# Patient Record
Sex: Female | Born: 1950 | Hispanic: Yes | Marital: Married | Smoking: Never smoker
Health system: Southern US, Community
[De-identification: ages and names within clinical notes are randomized; demographics above are authoritative.]

## PROBLEM LIST (undated history)

## (undated) DIAGNOSIS — E119 Type 2 diabetes mellitus without complications: Secondary | ICD-10-CM

## (undated) HISTORY — PX: CHOLECYSTECTOMY: SHX55

## (undated) HISTORY — PX: OTHER SURGICAL HISTORY: SHX169

---

## 2014-08-28 ENCOUNTER — Emergency Department (HOSPITAL_COMMUNITY): Payer: Medicaid Other

## 2014-08-28 ENCOUNTER — Observation Stay (HOSPITAL_COMMUNITY)
Admission: AD | Admit: 2014-08-28 | Discharge: 2014-08-30 | Disposition: A | Discharge status: Home / Self Care | Payer: Medicaid Other | Attending: Internal Medicine | Admitting: Internal Medicine

## 2014-08-28 ENCOUNTER — Encounter (HOSPITAL_COMMUNITY): Payer: Self-pay | Admitting: Emergency Medicine

## 2014-08-28 DIAGNOSIS — A059 Bacterial foodborne intoxication, unspecified: Secondary | ICD-10-CM | POA: Diagnosis present

## 2014-08-28 DIAGNOSIS — N179 Acute kidney failure, unspecified: Secondary | ICD-10-CM | POA: Insufficient documentation

## 2014-08-28 DIAGNOSIS — E872 Acidosis: Secondary | ICD-10-CM | POA: Diagnosis not present

## 2014-08-28 DIAGNOSIS — Z794 Long term (current) use of insulin: Secondary | ICD-10-CM | POA: Diagnosis not present

## 2014-08-28 DIAGNOSIS — E119 Type 2 diabetes mellitus without complications: Secondary | ICD-10-CM | POA: Insufficient documentation

## 2014-08-28 DIAGNOSIS — K529 Noninfective gastroenteritis and colitis, unspecified: Secondary | ICD-10-CM | POA: Diagnosis not present

## 2014-08-28 DIAGNOSIS — E8729 Other acidosis: Secondary | ICD-10-CM | POA: Diagnosis present

## 2014-08-28 DIAGNOSIS — R911 Solitary pulmonary nodule: Secondary | ICD-10-CM | POA: Diagnosis not present

## 2014-08-28 DIAGNOSIS — Z79899 Other long term (current) drug therapy: Secondary | ICD-10-CM | POA: Insufficient documentation

## 2014-08-28 DIAGNOSIS — R109 Unspecified abdominal pain: Secondary | ICD-10-CM | POA: Diagnosis present

## 2014-08-28 DIAGNOSIS — R112 Nausea with vomiting, unspecified: Secondary | ICD-10-CM | POA: Diagnosis present

## 2014-08-28 DIAGNOSIS — R197 Diarrhea, unspecified: Secondary | ICD-10-CM

## 2014-08-28 HISTORY — DX: Type 2 diabetes mellitus without complications: E11.9

## 2014-08-28 LAB — COMPREHENSIVE METABOLIC PANEL
ALK PHOS: 115 U/L (ref 39–117)
ALT: 31 U/L (ref 0–35)
ANION GAP: 22 — AB (ref 5–15)
AST: 37 U/L (ref 0–37)
Albumin: 4.3 g/dL (ref 3.5–5.2)
BILIRUBIN TOTAL: 0.6 mg/dL (ref 0.3–1.2)
BUN: 30 mg/dL — ABNORMAL HIGH (ref 6–23)
CHLORIDE: 93 meq/L — AB (ref 96–112)
CO2: 18 mEq/L — ABNORMAL LOW (ref 19–32)
CREATININE: 1.19 mg/dL — AB (ref 0.50–1.10)
Calcium: 9.6 mg/dL (ref 8.4–10.5)
GFR calc Af Amer: 56 mL/min — ABNORMAL LOW (ref >90)
GFR, EST NON AFRICAN AMERICAN: 48 mL/min — AB (ref >90)
Glucose, Bld: 227 mg/dL — ABNORMAL HIGH (ref 70–99)
Potassium: 3.6 mEq/L — ABNORMAL LOW (ref 3.7–5.3)
Sodium: 133 mEq/L — ABNORMAL LOW (ref 137–147)
Total Protein: 8.3 g/dL (ref 6.0–8.3)

## 2014-08-28 LAB — URINALYSIS, ROUTINE W REFLEX MICROSCOPIC
BILIRUBIN URINE: NEGATIVE
Glucose, UA: 500 mg/dL — AB
KETONES UR: 40 mg/dL — AB
Leukocytes, UA: NEGATIVE
NITRITE: NEGATIVE
Protein, ur: 30 mg/dL — AB
Specific Gravity, Urine: 1.016 (ref 1.005–1.030)
UROBILINOGEN UA: 0.2 mg/dL (ref 0.0–1.0)
pH: 6 (ref 5.0–8.0)

## 2014-08-28 LAB — CBC WITH DIFFERENTIAL/PLATELET
BASOS PCT: 0 % (ref 0–1)
Basophils Absolute: 0 10*3/uL (ref 0.0–0.1)
Eosinophils Absolute: 0 10*3/uL (ref 0.0–0.7)
Eosinophils Relative: 0 % (ref 0–5)
HEMATOCRIT: 42.7 % (ref 36.0–46.0)
HEMOGLOBIN: 15.4 g/dL — AB (ref 12.0–15.0)
LYMPHS PCT: 9 % — AB (ref 12–46)
Lymphs Abs: 1 10*3/uL (ref 0.7–4.0)
MCH: 32.8 pg (ref 26.0–34.0)
MCHC: 36.1 g/dL — AB (ref 30.0–36.0)
MCV: 91 fL (ref 78.0–100.0)
MONO ABS: 0.2 10*3/uL (ref 0.1–1.0)
Monocytes Relative: 2 % — ABNORMAL LOW (ref 3–12)
Neutro Abs: 9.8 10*3/uL — ABNORMAL HIGH (ref 1.7–7.7)
Neutrophils Relative %: 89 % — ABNORMAL HIGH (ref 43–77)
Platelets: 198 10*3/uL (ref 150–400)
RBC: 4.69 MIL/uL (ref 3.87–5.11)
RDW: 12.4 % (ref 11.5–15.5)
WBC: 11 10*3/uL — ABNORMAL HIGH (ref 4.0–10.5)

## 2014-08-28 LAB — BASIC METABOLIC PANEL
Anion gap: 19 — ABNORMAL HIGH (ref 5–15)
BUN: 21 mg/dL (ref 6–23)
CO2: 20 mEq/L (ref 19–32)
Calcium: 8.6 mg/dL (ref 8.4–10.5)
Chloride: 96 mEq/L (ref 96–112)
Creatinine, Ser: 0.83 mg/dL (ref 0.50–1.10)
GFR calc Af Amer: 86 mL/min — ABNORMAL LOW (ref >90)
GFR calc non Af Amer: 74 mL/min — ABNORMAL LOW (ref >90)
Glucose, Bld: 189 mg/dL — ABNORMAL HIGH (ref 70–99)
Potassium: 3.9 mEq/L (ref 3.7–5.3)
Sodium: 135 mEq/L — ABNORMAL LOW (ref 137–147)

## 2014-08-28 LAB — URINE MICROSCOPIC-ADD ON

## 2014-08-28 LAB — LACTIC ACID, PLASMA: Lactic Acid, Venous: 1.2 mmol/L (ref 0.5–2.2)

## 2014-08-28 LAB — CBC
HEMATOCRIT: 40 % (ref 36.0–46.0)
Hemoglobin: 14.4 g/dL (ref 12.0–15.0)
MCH: 32.5 pg (ref 26.0–34.0)
MCHC: 36 g/dL (ref 30.0–36.0)
MCV: 90.3 fL (ref 78.0–100.0)
Platelets: 184 10*3/uL (ref 150–400)
RBC: 4.43 MIL/uL (ref 3.87–5.11)
RDW: 12.2 % (ref 11.5–15.5)
WBC: 11.8 10*3/uL — ABNORMAL HIGH (ref 4.0–10.5)

## 2014-08-28 LAB — CBG MONITORING, ED: Glucose-Capillary: 176 mg/dL — ABNORMAL HIGH (ref 70–99)

## 2014-08-28 LAB — LIPASE, BLOOD: LIPASE: 44 U/L (ref 11–59)

## 2014-08-28 MED ORDER — INSULIN ASPART 100 UNIT/ML ~~LOC~~ SOLN
0.0000 [IU] | SUBCUTANEOUS | Status: DC
  Administered 2014-08-28 – 2014-08-29 (×2): 2 [IU] via SUBCUTANEOUS
  Administered 2014-08-29: 3 [IU] via SUBCUTANEOUS
  Administered 2014-08-29: 2 [IU] via SUBCUTANEOUS
  Administered 2014-08-29 (×2): 1 [IU] via SUBCUTANEOUS
  Administered 2014-08-30: 2 [IU] via SUBCUTANEOUS
  Administered 2014-08-30: 3 [IU] via SUBCUTANEOUS
  Filled 2014-08-28: qty 1

## 2014-08-28 MED ORDER — SODIUM CHLORIDE 0.9 % IV SOLN: Freq: Once | INTRAVENOUS | Status: DC

## 2014-08-28 MED ORDER — HEPARIN SODIUM (PORCINE) 5000 UNIT/ML IJ SOLN
5000.0000 [IU] | Freq: Three times a day (TID) | INTRAMUSCULAR | Status: DC
  Administered 2014-08-28 – 2014-08-30 (×5): 5000 [IU] via SUBCUTANEOUS
  Filled 2014-08-28 (×8): qty 1

## 2014-08-28 MED ORDER — SODIUM CHLORIDE 0.9 % IV SOLN
Freq: Once | INTRAVENOUS | Status: AC
  Administered 2014-08-28: 18:00:00 via INTRAVENOUS

## 2014-08-28 MED ORDER — ONDANSETRON HCL 4 MG/2ML IJ SOLN
4.0000 mg | Freq: Four times a day (QID) | INTRAMUSCULAR | Status: DC | PRN
  Administered 2014-08-28 – 2014-08-29 (×2): 4 mg via INTRAVENOUS
  Filled 2014-08-28 (×2): qty 2

## 2014-08-28 MED ORDER — SODIUM CHLORIDE 0.9 % IV BOLUS (SEPSIS)
1000.0000 mL | Freq: Once | INTRAVENOUS | Status: AC
  Administered 2014-08-28: 1000 mL via INTRAVENOUS

## 2014-08-28 MED ORDER — MORPHINE SULFATE 4 MG/ML IJ SOLN
4.0000 mg | INTRAMUSCULAR | Status: DC | PRN
  Administered 2014-08-28 (×2): 4 mg via INTRAVENOUS
  Filled 2014-08-28 (×2): qty 1

## 2014-08-28 MED ORDER — SODIUM CHLORIDE 0.9 % IV SOLN
INTRAVENOUS | Status: DC
  Administered 2014-08-28 (×2): via INTRAVENOUS

## 2014-08-28 MED ORDER — MORPHINE SULFATE 4 MG/ML IJ SOLN: 4.0000 mg | INTRAMUSCULAR | Status: DC | PRN

## 2014-08-28 MED ORDER — IOHEXOL 300 MG/ML  SOLN: 100.0000 mL | Freq: Once | INTRAMUSCULAR | Status: AC | PRN

## 2014-08-28 MED ORDER — SODIUM CHLORIDE 0.9 % IV BOLUS (SEPSIS): 1000.0000 mL | Freq: Once | INTRAVENOUS | Status: DC

## 2014-08-28 MED ORDER — ONDANSETRON HCL 4 MG/2ML IJ SOLN
4.0000 mg | Freq: Once | INTRAMUSCULAR | Status: AC
  Administered 2014-08-28: 4 mg via INTRAVENOUS
  Filled 2014-08-28: qty 2

## 2014-08-28 MED ORDER — SODIUM CHLORIDE 0.9 % IV BOLUS (SEPSIS)
500.0000 mL | Freq: Once | INTRAVENOUS | Status: AC
  Administered 2014-08-28: 500 mL via INTRAVENOUS

## 2014-08-28 MED ORDER — INSULIN DETEMIR 100 UNIT/ML ~~LOC~~ SOLN
10.0000 [IU] | Freq: Every day | SUBCUTANEOUS | Status: DC
  Administered 2014-08-28: 10 [IU] via SUBCUTANEOUS
  Filled 2014-08-28: qty 0.1

## 2014-08-28 NOTE — H&P (Signed)
[facility] History and Physical  Cindy MarusMaria Beasley ZOX:[id number] DOB: 02/14/1951 DOA: 08/28/2014  Referring physician: EDP PCP: No primary care provider on file.   Chief Complaint: N/V/D abd pain   HPI: Cindy MarusMaria Hinchman is a 63 y.o. female h/o DM2, presents to ED with N/V/D and abdominal pain.  [place]ptoms [place] in [place].  This occurs in the context of [place] a 2 month trip to [place].  Daughter had milder but similar symptoms which resolved she states.  Symptoms include chills on Monday but none since then.  [facility] and [facility] treated with IVF and antiemetics.  Has not felt better until yesterday, but last night symptoms recurred.  Did not take insulin last night.  Review of Systems: Systems reviewed.  As above, otherwise negative  Past Medical History  Diagnosis Date  . Diabetes mellitus without complication    Past Surgical History  Procedure Laterality Date  . Cholecystectomy    . 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 She has never used smokeless tobacco. She reports that she does not drink alcohol or use illicit drugs.  No Known Allergies  History reviewed. No pertinent family history.   Prior to Admission medications   Medication Sig Start Date End Date Taking? Authorizing Provider  insulin detemir (LEVEMIR) 100 UNIT/ML injection Inject 30 Units into the skin at bedtime.   Yes Historical Provider, MD  metFORMIN (GLUCOPHAGE) 850 MG tablet Take 850 mg by mouth 2 (two) times daily with a meal.   Yes Historical Provider, MD   Physical Exam: Filed Vitals:   08/28/14 1830  BP: 167/85  Pulse: 99  Temp:   Resp: 12    BP 167/85 mmHg  Pulse 99  Temp(Src) 98.2 F (36.8 C) (Oral)  Resp 12  SpO2 93%  General Appearance:    Alert, oriented, no distress, appears stated age  Head:    Normocephalic, atraumatic  Eyes:    PERRL, EOMI, sclera non-icteric         Nose:   Nares without drainage or epistaxis. Mucosa, turbinates normal  Throat:   Moist mucous membranes. Oropharynx without erythema or exudate.  Neck:   Supple. No carotid bruits.  No thyromegaly.  No lymphadenopathy.   Back:     No CVA tenderness, no spinal tenderness  Lungs:     Clear to auscultation bilaterally, without wheezes, rhonchi or rales  Chest wall:    No tenderness to palpitation  Heart:    Regular rate and rhythm without murmurs, gallops, rubs  Abdomen:     Soft, non-tender, nondistended, normal bowel sounds, no organomegaly  Genitalia:    deferred  Rectal:    deferred  Extremities:   No clubbing, cyanosis or edema.  Pulses:   2+ and symmetric all extremities  Skin:   Skin color, texture, turgor normal, no rashes or lesions  Lymph nodes:   Cervical, supraclavicular, and axillary nodes normal  Neurologic:   CNII-XII intact. Normal strength, sensation and reflexes      throughout    Labs on Admission:  Basic Metabolic Panel:  Recent Labs Lab 08/28/14 1420  NA 133*  K 3.6*  CL 93*  CO2 18*  GLUCOSE 227*  BUN 30*  CREATININE 1.19*  CALCIUM 9.6   Liver Function Tests:  Recent Labs Lab 08/28/14 1420  AST 37  ALT 31  ALKPHOS 115  BILITOT  0.6  PROT 8.3  ALBUMIN 4.3    Recent Labs Lab 08/28/14 1420  LIPASE 44   No results for input(s): AMMONIA in the last 168 hours. CBC:  Recent Labs Lab 08/28/14 1420  WBC 11.0*  NEUTROABS 9.8*  HGB 15.4*  HCT 42.7  MCV 91.0  PLT 198   Cardiac Enzymes: No results for input(s): CKTOTAL, CKMB, CKMBINDEX, TROPONINI in the last 168 hours.  BNP (last 3 results) No results for input(s): PROBNP in the last 8760 hours. CBG:  Recent Labs Lab 08/28/14 1930  GLUCAP 176*    Radiological Exams on Admission: Ct Abdomen Pelvis Wo Contrast  08/28/2014   CLINICAL DATA:  Abdominal pain, left upper quadrant.  EXAM: CT ABDOMEN AND PELVIS WITHOUT CONTRAST  TECHNIQUE: Multidetector CT imaging of the abdomen and  pelvis was performed following the standard protocol without IV contrast.  COMPARISON:  None.  FINDINGS: BODY WALL: Unremarkable.  LOWER CHEST: 2 5 mm pulmonary nodules in the right lower lobe, present on images 2 and 4.  ABDOMEN/PELVIS:  Liver: No focal abnormality.  Biliary: Cholecystectomy.  Pancreas: Unremarkable.  Spleen: Unremarkable.  Adrenals: Unremarkable.  Kidneys and ureters: Fullness of the urinary collecting system secondary to bladder distention. No stone or other obstructive process identified. Two presumed cyst in the interpolar left kidney, measuring approximately 1 cm.  Bladder: Distended.  Reproductive: Unremarkable.  Bowel: No obstruction. Appendix not identified; no pericecal inflammation.  Retroperitoneum: No mass or adenopathy.  Peritoneum: No ascites or pneumoperitoneum.  Vascular: No acute abnormality.  OSSEOUS: Spinal canal stenosis at L2-3 and below secondary to facet overgrowth, short pedicles, and degenerative disc disease.  IMPRESSION: 1. No acute intra-abdominal findings. 2. Distended urinary bladder. 3. Two 5 mm pulmonary nodules in the right lower lobe. If the patient is at high risk for bronchogenic carcinoma, follow-up chest CT at 6-12 months is recommended. If the patient is at low risk for bronchogenic carcinoma, follow-up chest CT at 12 months is recommended. This recommendation follows the consensus statement: Guidelines for Management of Small Pulmonary Nodules Detected on CT Scans: A Statement from the Fleischner Society as published in Radiology 2005;237:395-400.   Electronically Signed   By: Tiburcio PeaJonathan  Watts M.D.   On: 08/28/2014 18:22    EKG: Independently reviewed.  Assessment/Plan Active Problems:   Nausea vomiting and diarrhea   DM2 (diabetes mellitus, type 2)   Food poisoning   High anion gap metabolic acidosis   1. N/V/D and abdominal pain - likely due to food poisoning given daughter similar symptoms after eating same meal. 1. IVF, nausea and pain  control 2. High anion gap metabolic acidosis - AG mildly elevated at 22 1. Does have few keytones in urine, but doubt DKA as the primary issue given BGL was only 220 on arrival in this type 2 diabetic 2. Checking lactic acid 3. Will see if this has cleared up after 3L IVF she got in ED by rechecking BMP now. 3. DM2 - putting patient on levimir 10 units QHS (takes 30 at home), and low dose SSI q4h for now.    Code Status: Full Code  Family Communication: Daughter at bedside Disposition Plan: Admit to obs   Time spent: 50 min  GARDNER, JARED M. [facility] Pager [phone]  If 7AM-7PM, please contact the day team taking care of the patient [email or web address] Password [email or web address] 08/28/2014, 7:48 PM

## 2014-08-28 NOTE — ED Notes (Addendum)
[facility] c/o LUQ abdominal pain that radiates to the back since Sunday. She is own her back to [place] from [place] in [place] she has visited to x 2 months. Nausea and vomiting present. [facility] and was diagnosed with GI virus. Pain 10/10. Hx of DM and has had her gallbladder removed 4 years ago.

## 2014-08-28 NOTE — ED Provider Notes (Signed)
CSN: [id number]     Arrival date & time 08/28/14  1339 History   First MD Initiated Contact with Patient 08/28/14 1456     Chief Complaint  Patient presents with  . Abdominal Pain  . Nausea  . Emesis      HPI  His recent evaluation of abdominal pain, nausea, vomiting, diarrhea. History of take 2, no insulin dependent diabetes. Is been visiting in [place] for 2 months. Traveling via bus back to [place] where she lives. Was in [facility] on Monday with a 12 hour illness. Was treated with fluids and antiemetics. Given prescription for Zofran, Bentyl, and Protonix. Did not feel days. Had a better day yesterday recurred with symptoms last night vomiting and diarrhea "every hour to". Brought by EMS from the bus station to here.  Monday felt chilled. Uncertain if she had fever. No fever shakes chills since then. No dyspnea. No cough or urinary symptoms. Heme-negative nonbilious emesis. No bloody stools.  Past Medical History  Diagnosis Date  . Diabetes mellitus without complication    Past Surgical History  Procedure Laterality Date  . Cholecystectomy     No family history on file. History  Substance Use Topics  . Smoking status: Never Smoker   . Smokeless tobacco: Not on file  . Alcohol Use: No   OB History    No data available     Review of Systems  Constitutional: Negative for fever, chills, diaphoresis, appetite change and fatigue.  HENT: Negative for mouth sores, sore throat and trouble swallowing.   Eyes: Negative for visual disturbance.  Respiratory: Negative for cough, chest tightness, shortness of breath and wheezing.   Cardiovascular: Negative for chest pain.  Gastrointestinal: Positive for nausea, vomiting, abdominal pain and diarrhea. Negative for abdominal distention.  Endocrine: Negative for polydipsia, polyphagia and polyuria.  Genitourinary: Negative for dysuria, frequency and hematuria.  Musculoskeletal: Negative for gait problem.  Skin: Negative for  color change, pallor and rash.  Neurological: Positive for dizziness. Negative for syncope, light-headedness and headaches.  Hematological: Does not bruise/bleed easily.  Psychiatric/Behavioral: Negative for behavioral problems and confusion.      Allergies  Review of patient's allergies indicates no known allergies.  Home Medications   Prior to Admission medications   Medication Sig Start Date End Date Taking? Authorizing Provider  insulin detemir (LEVEMIR) 100 UNIT/ML injection Inject 30 Units into the skin at bedtime.   Yes Historical Provider, MD  metFORMIN (GLUCOPHAGE) 850 MG tablet Take 850 mg by mouth 2 (two) times daily with a meal.   Yes Historical Provider, MD   BP 167/85 mmHg  Pulse 99  Temp(Src) 98.2 F (36.8 C) (Oral)  Resp 12  SpO2 93% Physical Exam  Constitutional: She is oriented to person, place, and time. She appears well-developed and well-nourished. No distress.  HENT:  Head: Normocephalic.  Eyes: Conjunctivae are normal. Pupils are equal, round, and reactive to light. No scleral icterus.  Neck: Normal range of motion. Neck supple. No thyromegaly present.  Cardiovascular: Normal rate and regular rhythm.  Exam reveals no gallop and no friction rub.   No murmur heard. Pulmonary/Chest: Effort normal and breath sounds normal. No respiratory distress. She has no wheezes. She has no rales.  Abdominal: Soft. Bowel sounds are normal. She exhibits no distension. There is no tenderness. There is no rebound.  Tender in the left upper abdomen. Present bowel sounds. No peritoneal irritation. Healed cholecystectomy scar.  Musculoskeletal: Normal range of motion.  Neurological: She is alert and oriented  to person, place, and time.  Skin: Skin is warm and dry. No rash noted.  Psychiatric: She has a normal mood and affect. Her behavior is normal.    ED Course  Procedures (including critical care time) Labs Review Labs Reviewed  CBC WITH DIFFERENTIAL - Abnormal; Notable  for the following:    WBC 11.0 (*)    Hemoglobin 15.4 (*)    MCHC 36.1 (*)    Neutrophils Relative % 89 (*)    Neutro Abs 9.8 (*)    Lymphocytes Relative 9 (*)    Monocytes Relative 2 (*)    All other components within normal limits  COMPREHENSIVE METABOLIC PANEL - Abnormal; Notable for the following:    Sodium 133 (*)    Potassium 3.6 (*)    Chloride 93 (*)    CO2 18 (*)    Glucose, Bld 227 (*)    BUN 30 (*)    Creatinine, Ser 1.19 (*)    GFR calc non Af Amer 48 (*)    GFR calc Af Amer 56 (*)    Anion gap 22 (*)    All other components within normal limits  URINALYSIS, ROUTINE W REFLEX MICROSCOPIC - Abnormal; Notable for the following:    Glucose, UA 500 (*)    Hgb urine dipstick SMALL (*)    Ketones, ur 40 (*)    Protein, ur 30 (*)    All other components within normal limits  LIPASE, BLOOD  URINE MICROSCOPIC-ADD ON    Imaging Review Ct Abdomen Pelvis Wo Contrast  08/28/2014   CLINICAL DATA:  Abdominal pain, left upper quadrant.  EXAM: CT ABDOMEN AND PELVIS WITHOUT CONTRAST  TECHNIQUE: Multidetector CT imaging of the abdomen and pelvis was performed following the standard protocol without IV contrast.  COMPARISON:  None.  FINDINGS: BODY WALL: Unremarkable.  LOWER CHEST: 2 5 mm pulmonary nodules in the right lower lobe, present on images 2 and 4.  ABDOMEN/PELVIS:  Liver: No focal abnormality.  Biliary: Cholecystectomy.  Pancreas: Unremarkable.  Spleen: Unremarkable.  Adrenals: Unremarkable.  Kidneys and ureters: Fullness of the urinary collecting system secondary to bladder distention. No stone or other obstructive process identified. Two presumed cyst in the interpolar left kidney, measuring approximately 1 cm.  Bladder: Distended.  Reproductive: Unremarkable.  Bowel: No obstruction. Appendix not identified; no pericecal inflammation.  Retroperitoneum: No mass or adenopathy.  Peritoneum: No ascites or pneumoperitoneum.  Vascular: No acute abnormality.  OSSEOUS: Spinal canal  stenosis at L2-3 and below secondary to facet overgrowth, short pedicles, and degenerative disc disease.  IMPRESSION: 1. No acute intra-abdominal findings. 2. Distended urinary bladder. 3. Two 5 mm pulmonary nodules in the right lower lobe. If the patient is at high risk for bronchogenic carcinoma, follow-up chest CT at 6-12 months is recommended. If the patient is at low risk for bronchogenic carcinoma, follow-up chest CT at 12 months is recommended. This recommendation follows the consensus statement: Guidelines for Management of Small Pulmonary Nodules Detected on CT Scans: A Statement from the Fleischner Society as published in Radiology 2005;237:395-400.   Electronically Signed   By: Tiburcio PeaJonathan  Watts M.D.   On: 08/28/2014 18:22     EKG Interpretation None      MDM   Final diagnoses:  Abdominal pain    Patient CT scan shows no acute findings. On exam she has a sounds present. Not clinically in ileus. Her pain is better. If she remains nauseated. She drinks just a few sips of fluids and sensor watered-down  complains of nausea. Benign examined. She is traveling. She is diabetic. She is unable tolerate by mouth. We'll speak with the hospitalist regarding admission/observation.    Rolland PorterMark Ghalia Reicks, MD 09/02/14 [phone]

## 2014-08-28 NOTE — ED Notes (Signed)
Patient transported to CT 

## 2014-08-28 NOTE — ED Notes (Signed)
Bed: WA16 Expected date:  Expected time:  Means of arrival:  Comments: EMS 

## 2014-08-28 NOTE — ED Notes (Signed)
Patient back from CT.

## 2014-08-29 DIAGNOSIS — E1165 Type 2 diabetes mellitus with hyperglycemia: Secondary | ICD-10-CM

## 2014-08-29 LAB — GLUCOSE, CAPILLARY
Glucose-Capillary: 139 mg/dL — ABNORMAL HIGH (ref 70–99)
Glucose-Capillary: 143 mg/dL — ABNORMAL HIGH (ref 70–99)
Glucose-Capillary: 148 mg/dL — ABNORMAL HIGH (ref 70–99)
Glucose-Capillary: 212 mg/dL — ABNORMAL HIGH (ref 70–99)
Glucose-Capillary: 142 mg/dL — ABNORMAL HIGH (ref 70–99)
Glucose-Capillary: 157 mg/dL — ABNORMAL HIGH (ref 70–99)

## 2014-08-29 MED ORDER — INSULIN DETEMIR 100 UNIT/ML ~~LOC~~ SOLN
15.0000 [IU] | Freq: Every day | SUBCUTANEOUS | Status: DC
  Administered 2014-08-29: 15 [IU] via SUBCUTANEOUS
  Filled 2014-08-29: qty 0.15

## 2014-08-29 NOTE — Progress Notes (Signed)
[facility] PROGRESS NOTE  Cindy Beasley ZOX:[id number] DOB: 09-08-1951 DOA: 08/28/2014 PCP: No primary care provider on file.  Assessment/Plan: 1-Gastroenteritis; symptoms has improved. CT abdomen negative.  Advance diet today.   2-Anion gap metabolic acidosis; improved with fluids, insulin. Repeat B-met in am.   3-Small pulmonary nodule. Patient and daughter aware of results. Need CT in 12 month. She is non smoker.   4-Diabetes; increase levemir to 15 units. SSI.   5-AKI; related to decrease volume., improved with IV fluids.   Code Status: full code.  Family Communication: care discussed with daughter who was at bedside.  Disposition Plan: discharge 11-13 if tolerates diet.    Consultants:  none  Procedures:  none  Antibiotics:  none  HPI/Subjective: Feeling better, no abdominal pain, nausea has improved.   Objective: Filed Vitals:   08/29/14 1313  BP: 144/54  Pulse: 77  Temp: 98.1 F (36.7 C)  Resp: 20    Intake/Output Summary (Last 24 hours) at 08/29/14 1450 Last data filed at 08/29/14 0600  Gross per 24 hour  Intake 1214.58 ml  Output      0 ml  Net 1214.58 ml   Filed Weights   08/28/14 2105  Weight: 86.274 kg (190 lb 3.2 oz)    Exam:   General:  Alert in no distress.   Cardiovascular: S 1, S 2 RRR  Respiratory: CTA  Abdomen: BS present, soft, NT  Musculoskeletal: no edema.   Data Reviewed: Basic Metabolic Panel:  Recent Labs Lab 08/28/14 1420 08/28/14 1934  NA 133* 135*  K 3.6* 3.9  CL 93* 96  CO2 18* 20  GLUCOSE 227* 189*  BUN 30* 21  CREATININE 1.19* 0.83  CALCIUM 9.6 8.6   Liver Function Tests:  Recent Labs Lab 08/28/14 1420  AST 37  ALT 31  ALKPHOS 115  BILITOT 0.6  PROT 8.3  ALBUMIN 4.3    Recent Labs Lab 08/28/14 1420  LIPASE 44   No results for input(s): AMMONIA in the last 168 hours. CBC:  Recent Labs Lab 08/28/14 1420 08/28/14 1934  WBC 11.0* 11.8*  NEUTROABS 9.8*  --   HGB 15.4* 14.4   HCT 42.7 40.0  MCV 91.0 90.3  PLT 198 184   Cardiac Enzymes: No results for input(s): CKTOTAL, CKMB, CKMBINDEX, TROPONINI in the last 168 hours. BNP (last 3 results) No results for input(s): PROBNP in the last 8760 hours. CBG:  Recent Labs Lab 08/28/14 1930 08/28/14 2356 08/29/14 0401 08/29/14 0708 08/29/14 1134  GLUCAP 176* 148* 139* 143* 212*    No results found for this or any previous visit (from the past 240 hour(s)).   Studies: Ct Abdomen Pelvis Wo Contrast  08/28/2014   CLINICAL DATA:  Abdominal pain, left upper quadrant.  EXAM: CT ABDOMEN AND PELVIS WITHOUT CONTRAST  TECHNIQUE: Multidetector CT imaging of the abdomen and pelvis was performed following the standard protocol without IV contrast.  COMPARISON:  None.  FINDINGS: BODY WALL: Unremarkable.  LOWER CHEST: 2 5 mm pulmonary nodules in the right lower lobe, present on images 2 and 4.  ABDOMEN/PELVIS:  Liver: No focal abnormality.  Biliary: Cholecystectomy.  Pancreas: Unremarkable.  Spleen: Unremarkable.  Adrenals: Unremarkable.  Kidneys and ureters: Fullness of the urinary collecting system secondary to bladder distention. No stone or other obstructive process identified. Two presumed cyst in the interpolar left kidney, measuring approximately 1 cm.  Bladder: Distended.  Reproductive: Unremarkable.  Bowel: No obstruction. Appendix not identified; no pericecal inflammation.  Retroperitoneum: No mass or  adenopathy.  Peritoneum: No ascites or pneumoperitoneum.  Vascular: No acute abnormality.  OSSEOUS: Spinal canal stenosis at L2-3 and below secondary to facet overgrowth, short pedicles, and degenerative disc disease.  IMPRESSION: 1. No acute intra-abdominal findings. 2. Distended urinary bladder. 3. Two 5 mm pulmonary nodules in the right lower lobe. If the patient is at high risk for bronchogenic carcinoma, follow-up chest CT at 6-12 months is recommended. If the patient is at low risk for bronchogenic carcinoma, follow-up chest  CT at 12 months is recommended. This recommendation follows the consensus statement: Guidelines for Management of Small Pulmonary Nodules Detected on CT Scans: A Statement from the [employer] as published in Radiology 2005;237:395-400.   Electronically Signed   By: Jorje Guild M.D.   On: 08/28/2014 18:22    Scheduled Meds: . sodium chloride   Intravenous Once  . heparin  5,000 Units Subcutaneous 3 times per day  . insulin aspart  0-9 Units Subcutaneous 6 times per day  . insulin detemir  10 Units Subcutaneous QHS  . sodium chloride  1,000 mL Intravenous Once   Continuous Infusions: . sodium chloride 100 mL/hr at 08/29/14 1113    Active Problems:   Nausea vomiting and diarrhea   DM2 (diabetes mellitus, type 2)   Food poisoning   High anion gap metabolic acidosis    Time spent: 35 minutes.     Niel Hummer A  [facility] Pager [phone]. If 7PM-7AM, please contact night-coverage at [email or web address], password [email or web address] 08/29/2014, 2:50 PM  LOS: 1 day

## 2014-08-29 NOTE — Progress Notes (Signed)
UR completed 

## 2014-08-30 DIAGNOSIS — E11319 Type 2 diabetes mellitus with unspecified diabetic retinopathy without macular edema: Secondary | ICD-10-CM

## 2014-08-30 LAB — GLUCOSE, CAPILLARY
GLUCOSE-CAPILLARY: 94 mg/dL (ref 70–99)
GLUCOSE-CAPILLARY: 215 mg/dL — AB (ref 70–99)
Glucose-Capillary: 139 mg/dL — ABNORMAL HIGH (ref 70–99)
Glucose-Capillary: 148 mg/dL — ABNORMAL HIGH (ref 70–99)
Glucose-Capillary: 68 mg/dL — ABNORMAL LOW (ref 70–99)

## 2014-08-30 LAB — BASIC METABOLIC PANEL
Anion gap: 13 (ref 5–15)
BUN: 16 mg/dL (ref 6–23)
CO2: 25 mEq/L (ref 19–32)
Calcium: 8.7 mg/dL (ref 8.4–10.5)
Chloride: 102 mEq/L (ref 96–112)
Creatinine, Ser: 0.81 mg/dL (ref 0.50–1.10)
GFR calc non Af Amer: 76 mL/min — ABNORMAL LOW (ref >90)
GFR, EST AFRICAN AMERICAN: 88 mL/min — AB (ref >90)
Glucose, Bld: 158 mg/dL — ABNORMAL HIGH (ref 70–99)
POTASSIUM: 3.1 meq/L — AB (ref 3.7–5.3)
SODIUM: 140 meq/L (ref 137–147)

## 2014-08-30 MED ORDER — INSULIN DETEMIR 100 UNIT/ML ~~LOC~~ SOLN: 10.0000 [IU] | Freq: Every day | SUBCUTANEOUS | Status: AC

## 2014-08-30 MED ORDER — INSULIN DETEMIR 100 UNIT/ML ~~LOC~~ SOLN: 10.0000 [IU] | Freq: Every day | SUBCUTANEOUS | Status: DC

## 2014-08-30 MED ORDER — POTASSIUM CHLORIDE CRYS ER 20 MEQ PO TBCR
40.0000 meq | EXTENDED_RELEASE_TABLET | Freq: Once | ORAL | Status: AC
Start: 2014-08-30 — End: 2014-08-30
  Administered 2014-08-30: 40 meq via ORAL
  Filled 2014-08-30: qty 2

## 2014-08-30 MED ORDER — DEXTROSE 50 % IV SOLN
INTRAVENOUS | Status: AC
Start: 2014-08-30 — End: 2014-08-30
  Administered 2014-08-30: 50 mL
  Filled 2014-08-30: qty 50

## 2014-08-30 MED ORDER — INSULIN DETEMIR 100 UNIT/ML ~~LOC~~ SOLN: 12.0000 [IU] | Freq: Every day | SUBCUTANEOUS | Status: DC

## 2014-08-30 NOTE — Discharge Summary (Signed)
Physician Discharge Summary  Cindy MarusMaria Beasley YNW:[id number] DOB: 02/23/1951 DOA: 08/28/2014  PCP: No primary care provider on file.  Admit date: 08/28/2014 Discharge date: 08/30/2014  Time spent: 35 minutes  Recommendations for Outpatient Follow-up:  1. Need follow up Ct chest in 6 to 12 month to follow lung nodule.   Discharge Diagnoses:    Gastroenteritis.    Nausea vomiting and diarrhea   DM2 (diabetes mellitus, type 2)   Food poisoning   High anion gap metabolic acidosis   Discharge Condition: stable.  Diet recommendation: Carb Modified   Filed Weights   08/28/14 2105  Weight: 86.274 kg (190 lb 3.2 oz)    History of present illness:  Cindy MarusMaria Gingrich is a 63 y.o. female h/o DM2, presents to ED with N/V/D and abdominal pain. [place]. This occurs in the context of [place] a 2 [place] trip to [place]. Daughter had milder but similar symptoms which resolved she states. Symptoms include chills on Monday but none since then. [facility] and was treated with IVF and antiemetics. Has not felt better until yesterday, but last night symptoms recurred. Did not take insulin last night.  Hospital Course:  1-Gastroenteritis; symptoms has improved. CT abdomen negative.  Resolved with supportive care. Tolerating diet. No abdominal pain.   2-Anion gap metabolic acidosis; Resolved  with fluids, insulin.   3-Small pulmonary nodule. Patient and daughter aware of results. Need CT in 12 month. She is non smoker.   4-Diabetes; Discharge on  levemir to 10 units. SSI.   5-AKI; related to decrease volume., improved with IV fluids.   Procedures:  none  Consultations:  none  Discharge Exam: Filed Vitals:   08/30/14 0539  BP: 138/65  Pulse: 79  Temp: 98.3 F (36.8 C)  Resp: 20    General: Alert in no distress.  Cardiovascular: S 1, S 2 RRR Respiratory: CTA  Discharge Instructions You were cared for  by a hospitalist during your hospital stay. If you have any questions about your discharge medications or the care you received while you were in the hospital after you are discharged, you can call the unit and asked to speak with the hospitalist on call if the hospitalist that took care of you is not available. Once you are discharged, your primary care physician will handle any further medical issues. Please note that NO REFILLS for any discharge medications will be authorized once you are discharged, as it is imperative that you return to your primary care physician (or establish a relationship with a primary care physician if you do not have one) for your aftercare needs so that they can reassess your need for medications and monitor your lab values.  Discharge Instructions    Diet Carb Modified    Complete by:  As directed      Increase activity slowly    Complete by:  As directed           Current Discharge Medication List    CONTINUE these medications which have CHANGED   Details  insulin detemir (LEVEMIR) 100 UNIT/ML injection Inject 0.1 mLs (10 Units total) into the skin at bedtime. Qty: 10 mL, Refills: 11      CONTINUE these medications which have NOT CHANGED   Details  metFORMIN (GLUCOPHAGE) 850 MG tablet Take 850 mg by mouth 2 (two) times daily with a meal.       No Known Allergies  The results of significant diagnostics from this hospitalization (including imaging, microbiology, ancillary and laboratory) are listed below for reference.    Significant Diagnostic Studies: Ct Abdomen Pelvis Wo Contrast  08/28/2014   CLINICAL DATA:  Abdominal pain, left upper quadrant.  EXAM: CT ABDOMEN AND PELVIS WITHOUT CONTRAST  TECHNIQUE: Multidetector CT imaging of the abdomen and pelvis was performed following the standard protocol without IV contrast.  COMPARISON:  None.  FINDINGS: BODY WALL: Unremarkable.  LOWER CHEST: 2 5 mm pulmonary nodules in the right lower lobe, present on  images 2 and 4.  ABDOMEN/PELVIS:  Liver: No focal abnormality.  Biliary: Cholecystectomy.  Pancreas: Unremarkable.  Spleen: Unremarkable.  Adrenals: Unremarkable.  Kidneys and ureters: Fullness of the urinary collecting system secondary to bladder distention. No stone or other obstructive process identified. Two presumed cyst in the interpolar left kidney, measuring approximately 1 cm.  Bladder: Distended.  Reproductive: Unremarkable.  Bowel: No obstruction. Appendix not identified; no pericecal inflammation.  Retroperitoneum: No mass or adenopathy.  Peritoneum: No ascites or pneumoperitoneum.  Vascular: No acute abnormality.  OSSEOUS: Spinal canal stenosis at L2-3 and below secondary to facet overgrowth, short pedicles, and degenerative disc disease.  IMPRESSION: 1. No acute intra-abdominal findings. 2. Distended urinary bladder. 3. Two 5 mm pulmonary nodules in the right lower lobe. If the patient is at high risk for bronchogenic carcinoma, follow-up chest CT at 6-12 months is recommended. If the patient is at low risk for bronchogenic carcinoma, follow-up chest CT at 12 months is recommended. This recommendation follows the consensus statement: Guidelines for Management of Small Pulmonary Nodules Detected on CT Scans: A Statement from the [employer] as published in Radiology 2005;237:395-400.   Electronically Signed   By: Tiburcio PeaJonathan  Watts M.D.   On: 08/28/2014 18:22    Microbiology: No results found for this or any previous visit (from the past 240 hour(s)).   Labs: Basic Metabolic Panel:  Recent Labs Lab 08/28/14 1420 08/28/14 1934 08/30/14 0425  NA 133* 135* 140  K 3.6* 3.9 3.1*  CL 93* 96 102  CO2 18* 20 25  GLUCOSE 227* 189* 158*  BUN 30* 21 16  CREATININE 1.19* 0.83 0.81  CALCIUM 9.6 8.6 8.7   Liver Function Tests:  Recent Labs Lab 08/28/14 1420  AST 37  ALT 31  ALKPHOS 115  BILITOT 0.6  PROT 8.3  ALBUMIN 4.3    Recent Labs Lab 08/28/14 1420  LIPASE 44   No  results for input(s): AMMONIA in the last 168 hours. CBC:  Recent Labs Lab 08/28/14 1420 08/28/14 1934  WBC 11.0* 11.8*  NEUTROABS 9.8*  --   HGB 15.4* 14.4  HCT 42.7 40.0  MCV 91.0 90.3  PLT 198 184   Cardiac Enzymes: No results for input(s): CKTOTAL, CKMB, CKMBINDEX, TROPONINI in the last 168 hours. BNP: BNP (last 3 results) No results for input(s): PROBNP in the last 8760 hours. CBG:  Recent Labs Lab 08/30/14 0006 08/30/14 0401 08/30/14 0430 08/30/14 0719 08/30/14 1125  GLUCAP 148* 68* 139* 94 215*       Signed:  Emmagene Ortner A  [facility] 08/30/2014, 12:02 PM

## 2014-08-31 NOTE — Progress Notes (Signed)
Pt discharged home with her daughter. Discharge instructions provided to both patient and her daughter. Both verbalized understanding discharge instructions, follow-up appts, and medications. V S stable at the time of discharge. Pt denies pain and any other comments or concerns.

## 2015-10-16 IMAGING — CT CT ABD-PELV W/O CM
1 of 2 series · 15 of 32 positions shown, 19 images · non-contrast
Comparison: None.

CLINICAL DATA: Abdominal pain, left upper quadrant.

EXAM:
CT ABDOMEN AND PELVIS WITHOUT CONTRAST
TECHNIQUE: Multidetector CT imaging of the abdomen and pelvis was performed
following the standard protocol without IV contrast.

[Series 2: abd/pel w/o · axial · non-contrast · 0.86mm/px · z∈[+751,+1161]mm · 15 of 90 slices shown, 19 images]
[im 4/90  soft-tissue]
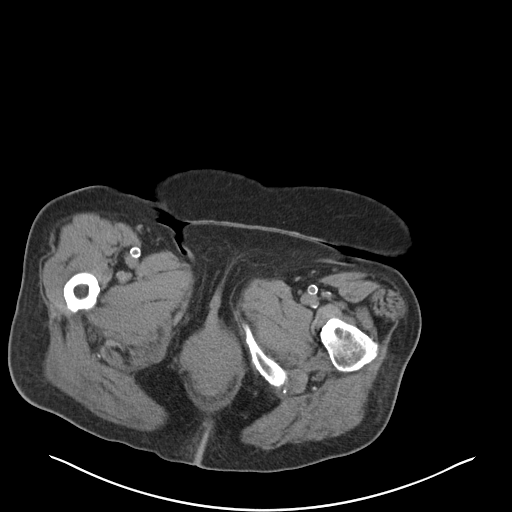
[im 4/90  bone]
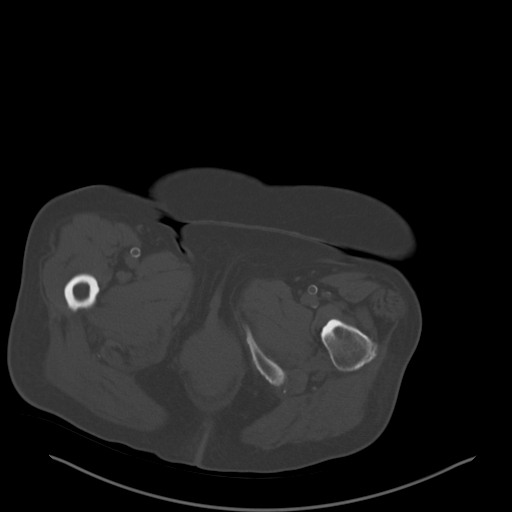
[im 11/90  soft-tissue]
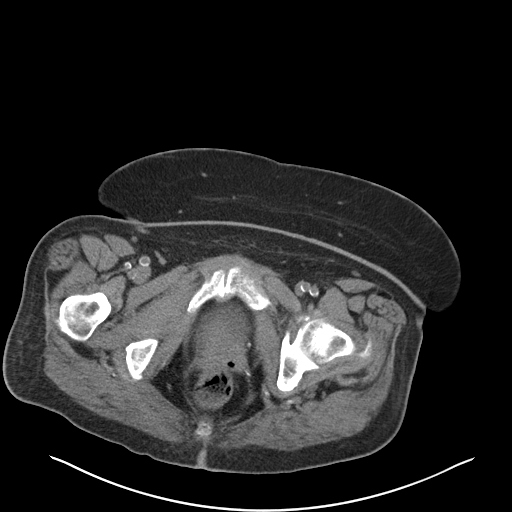
[im 18/90  soft-tissue]
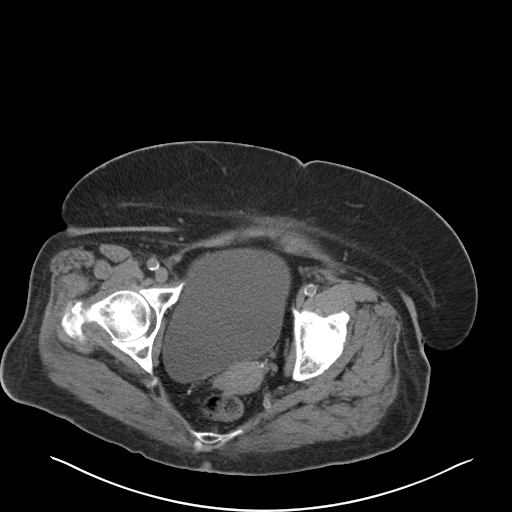
[im 24/90  soft-tissue]
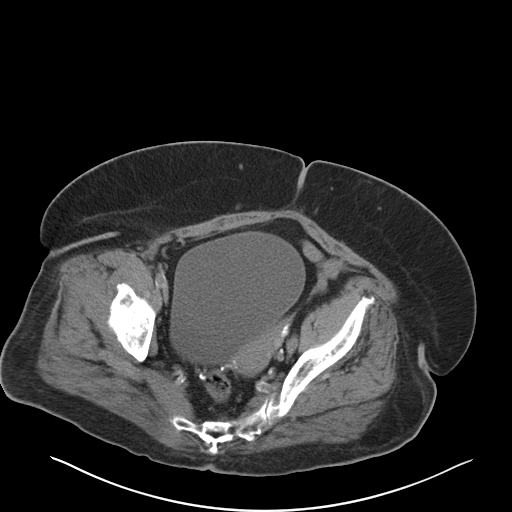
[im 31/90  soft-tissue]
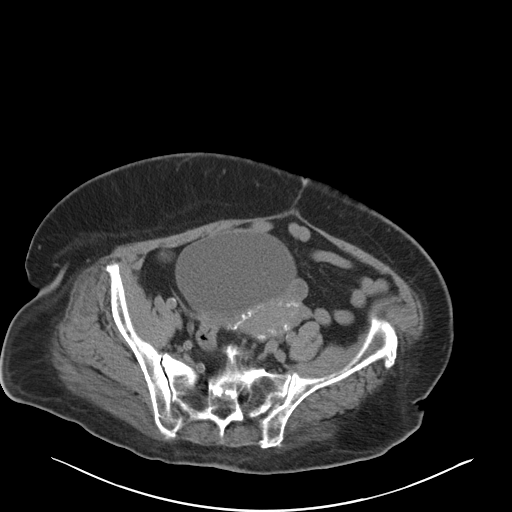
[im 38/90  soft-tissue]
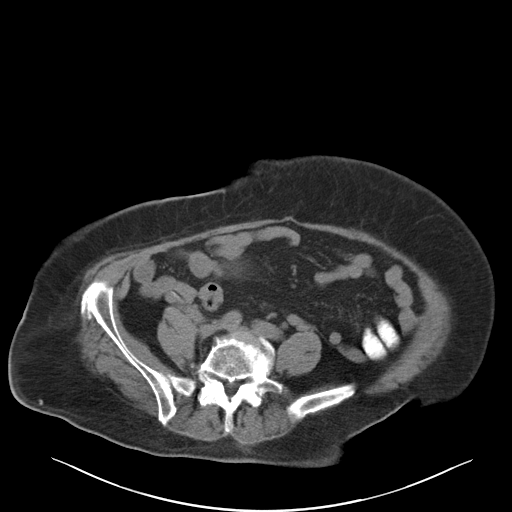
[im 45/90  soft-tissue]
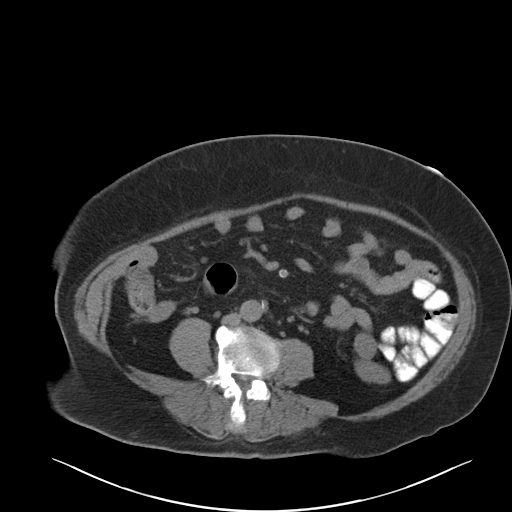
[im 52/90  soft-tissue]
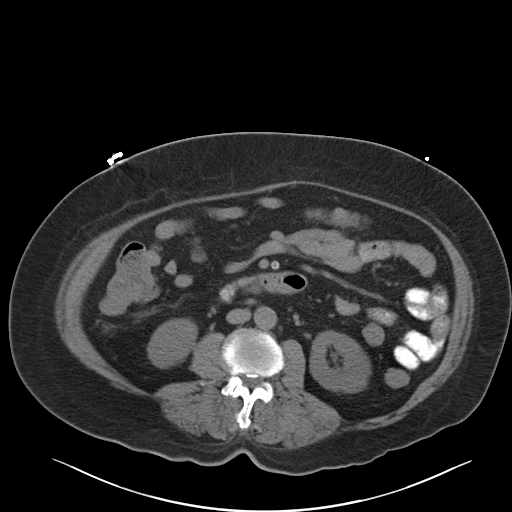
[im 59/90  soft-tissue]
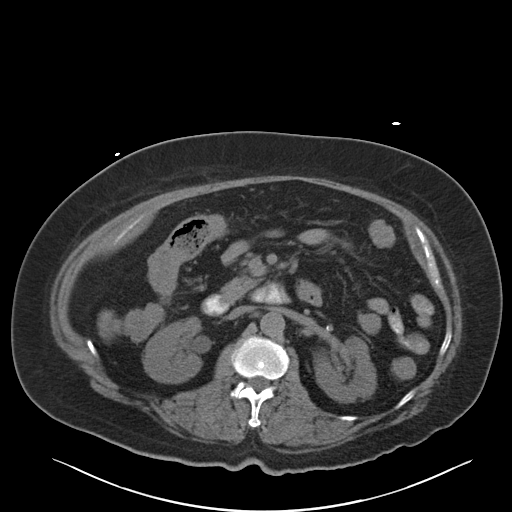
[im 59/90  bone]
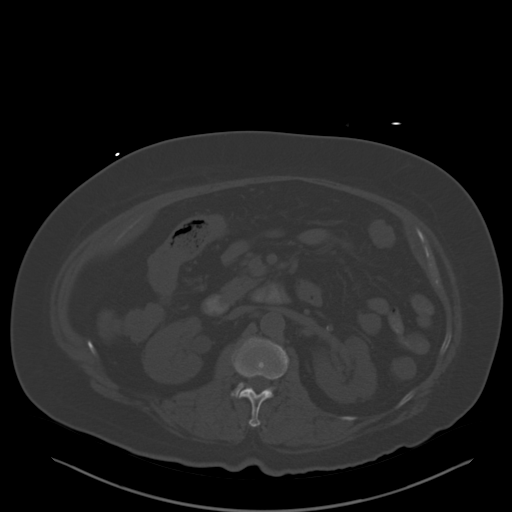
[im 66/90  soft-tissue]
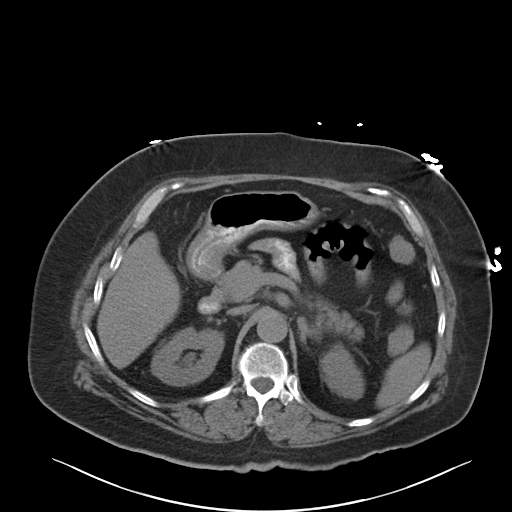
[im 72/90  soft-tissue]
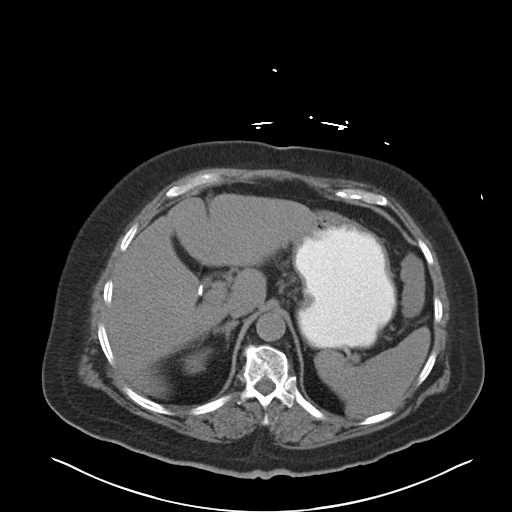
[im 76/90  lung]
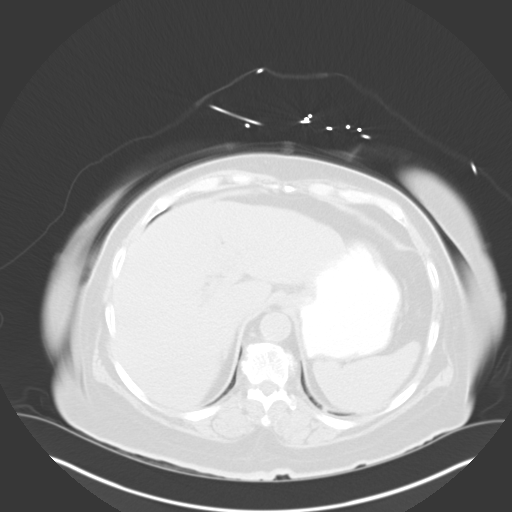
[im 79/90  soft-tissue]
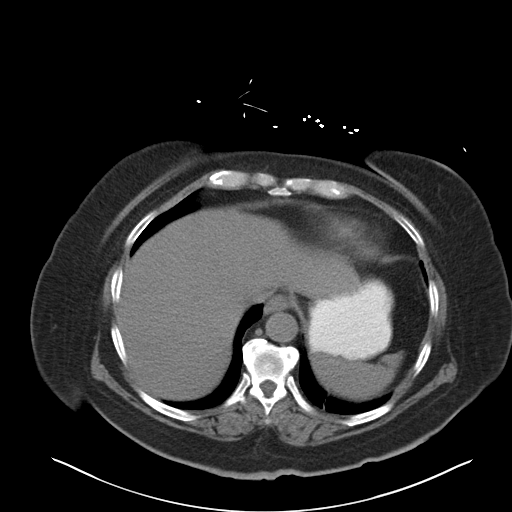
[im 79/90  lung]
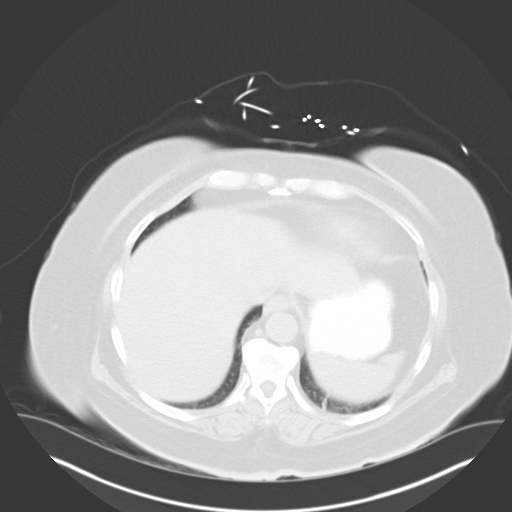
[im 83/90  lung]
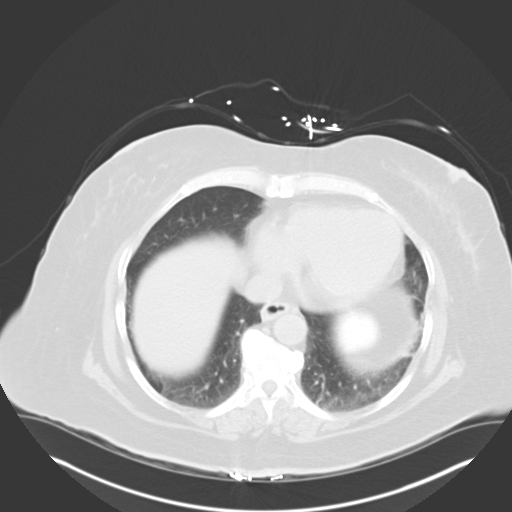
[im 86/90  soft-tissue]
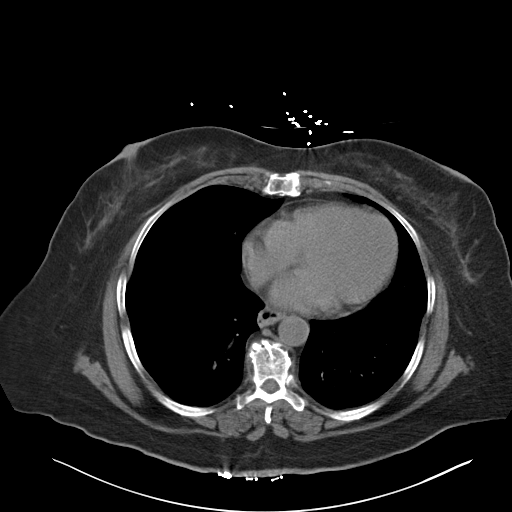
[im 86/90  lung]
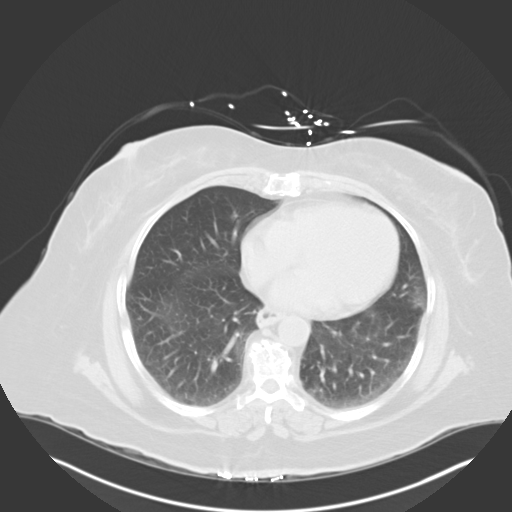

[15 of 32 positions shown; findings below may reference images not displayed]

FINDINGS: BODY WALL: Unremarkable.

LOWER CHEST: 2 5 mm pulmonary nodules in the right lower lobe,
present on images 2 and 4.

ABDOMEN/PELVIS:

Liver: No focal abnormality.

Biliary: Cholecystectomy.

Pancreas: Unremarkable.

Spleen: Unremarkable.

Adrenals: Unremarkable.

Kidneys and ureters: Fullness of the urinary collecting system
secondary to bladder distention. No stone or other obstructive
process identified. Two presumed cyst in the interpolar left kidney,
measuring approximately 1 cm.

Bladder: Distended.

Reproductive: Unremarkable.

Bowel: No obstruction. Appendix not identified; no pericecal
inflammation.

Retroperitoneum: No mass or adenopathy.

Peritoneum: No ascites or pneumoperitoneum.

Vascular: No acute abnormality.

OSSEOUS: Spinal canal stenosis at L2-3 and below secondary to facet
overgrowth, short pedicles, and degenerative disc disease.
IMPRESSION: 1. No acute intra-abdominal findings.
2. Distended urinary bladder.
3. Two 5 mm pulmonary nodules in the right lower lobe. If the
patient is at high risk for bronchogenic carcinoma, follow-up chest
CT at 6-12 months is recommended. If the patient is at low risk for
bronchogenic carcinoma, follow-up chest CT at 12 months is
recommended. This recommendation follows the consensus statement:
Guidelines for Management of Small Pulmonary Nodules Detected on CT
Scans: A Statement from the [HOSPITAL] as published in

## 2020-02-16 DEATH — deceased
